# Patient Record
Sex: Female | Born: 1980 | Race: Black or African American | Hispanic: No
Health system: Southern US, Community
[De-identification: ages and names within clinical notes are randomized; demographics above are authoritative.]

---

## 2009-02-28 ENCOUNTER — Emergency Department (HOSPITAL_BASED_OUTPATIENT_CLINIC_OR_DEPARTMENT_OTHER): Admission: EM | Admit: 2009-02-28 | Discharge: 2009-02-28 | Payer: Self-pay | Admitting: Emergency Medicine

## 2009-02-28 ENCOUNTER — Ambulatory Visit: Payer: Self-pay | Admitting: Diagnostic Radiology

## 2009-04-02 ENCOUNTER — Emergency Department (HOSPITAL_BASED_OUTPATIENT_CLINIC_OR_DEPARTMENT_OTHER): Admission: EM | Admit: 2009-04-02 | Discharge: 2009-04-02 | Payer: Self-pay | Admitting: Emergency Medicine

## 2010-08-22 LAB — URINALYSIS, ROUTINE W REFLEX MICROSCOPIC
Glucose, UA: NEGATIVE mg/dL
Specific Gravity, Urine: 1.033 — ABNORMAL HIGH (ref 1.005–1.030)

## 2010-08-22 LAB — URINE MICROSCOPIC-ADD ON

## 2011-04-30 ENCOUNTER — Other Ambulatory Visit (HOSPITAL_COMMUNITY): Payer: Self-pay | Admitting: Obstetrics and Gynecology

## 2011-04-30 DIAGNOSIS — Z369 Encounter for antenatal screening, unspecified: Secondary | ICD-10-CM

## 2011-05-15 ENCOUNTER — Ambulatory Visit (HOSPITAL_COMMUNITY): Payer: Self-pay

## 2011-05-15 ENCOUNTER — Other Ambulatory Visit (HOSPITAL_COMMUNITY): Payer: Self-pay

## 2011-05-16 ENCOUNTER — Other Ambulatory Visit (HOSPITAL_COMMUNITY): Payer: Self-pay | Admitting: Obstetrics and Gynecology

## 2011-05-16 DIAGNOSIS — Z369 Encounter for antenatal screening, unspecified: Secondary | ICD-10-CM

## 2011-05-21 ENCOUNTER — Ambulatory Visit (HOSPITAL_COMMUNITY)
Admission: RE | Admit: 2011-05-21 | Discharge: 2011-05-21 | Disposition: A | Discharge status: Home / Self Care | Payer: Medicaid Other | Source: Ambulatory Visit | Attending: Obstetrics and Gynecology | Admitting: Obstetrics and Gynecology

## 2011-05-21 ENCOUNTER — Ambulatory Visit (HOSPITAL_COMMUNITY): Admission: RE | Admit: 2011-05-21 | Payer: Medicaid Other | Source: Ambulatory Visit

## 2011-05-21 ENCOUNTER — Other Ambulatory Visit (HOSPITAL_COMMUNITY): Payer: Self-pay | Admitting: Obstetrics and Gynecology

## 2011-05-21 ENCOUNTER — Encounter (HOSPITAL_COMMUNITY): Payer: Self-pay

## 2011-05-21 DIAGNOSIS — Z3689 Encounter for other specified antenatal screening: Secondary | ICD-10-CM | POA: Insufficient documentation

## 2011-05-21 DIAGNOSIS — Z369 Encounter for antenatal screening, unspecified: Secondary | ICD-10-CM

## 2011-05-21 DIAGNOSIS — E669 Obesity, unspecified: Secondary | ICD-10-CM | POA: Insufficient documentation

## 2011-05-21 DIAGNOSIS — O99210 Obesity complicating pregnancy, unspecified trimester: Secondary | ICD-10-CM | POA: Insufficient documentation

## 2011-05-21 NOTE — Progress Notes (Signed)
Ms. Depasquale was seen for ultrasound appointment today.  Please see [facility] report for details.

## 2011-05-27 ENCOUNTER — Other Ambulatory Visit: Payer: Self-pay

## 2011-06-25 ENCOUNTER — Ambulatory Visit (HOSPITAL_COMMUNITY)
Admission: RE | Admit: 2011-06-25 | Discharge: 2011-06-25 | Disposition: A | Discharge status: Home / Self Care | Payer: Medicaid Other | Source: Ambulatory Visit | Attending: Obstetrics and Gynecology | Admitting: Obstetrics and Gynecology

## 2011-06-25 DIAGNOSIS — Z3689 Encounter for other specified antenatal screening: Secondary | ICD-10-CM

## 2011-06-25 DIAGNOSIS — Z363 Encounter for antenatal screening for malformations: Secondary | ICD-10-CM | POA: Insufficient documentation

## 2011-06-25 DIAGNOSIS — Z1389 Encounter for screening for other disorder: Secondary | ICD-10-CM | POA: Insufficient documentation

## 2011-06-25 DIAGNOSIS — O99210 Obesity complicating pregnancy, unspecified trimester: Secondary | ICD-10-CM | POA: Insufficient documentation

## 2011-06-25 DIAGNOSIS — O358XX0 Maternal care for other (suspected) fetal abnormality and damage, not applicable or unspecified: Secondary | ICD-10-CM | POA: Insufficient documentation

## 2011-06-25 DIAGNOSIS — E669 Obesity, unspecified: Secondary | ICD-10-CM | POA: Insufficient documentation

## 2011-08-06 ENCOUNTER — Ambulatory Visit (HOSPITAL_COMMUNITY): Admission: RE | Admit: 2011-08-06 | Payer: Medicaid Other | Source: Ambulatory Visit

## 2012-07-12 IMAGING — US US OB COMP +14 WK
1 series · 14 of 28 positions shown · non-contrast
Comparison: none

[Series 1: us ob comp +14 wk · 0.15mm/px · 14 of 31 slices shown]
[im 2/31]
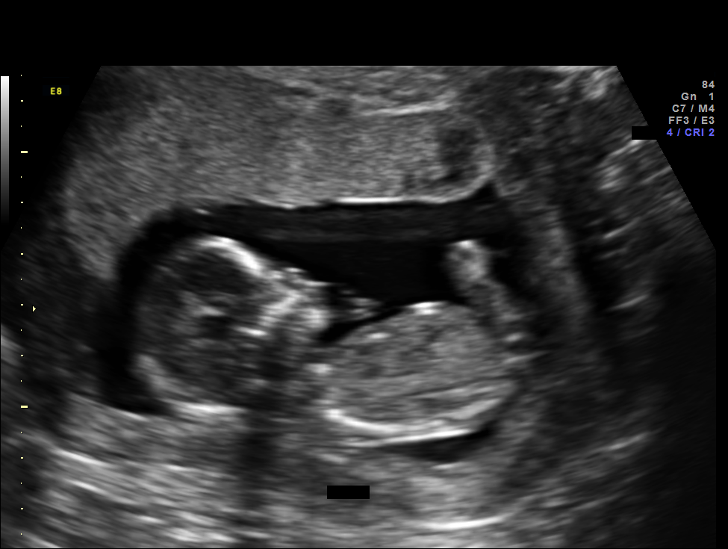
[im 4/31]
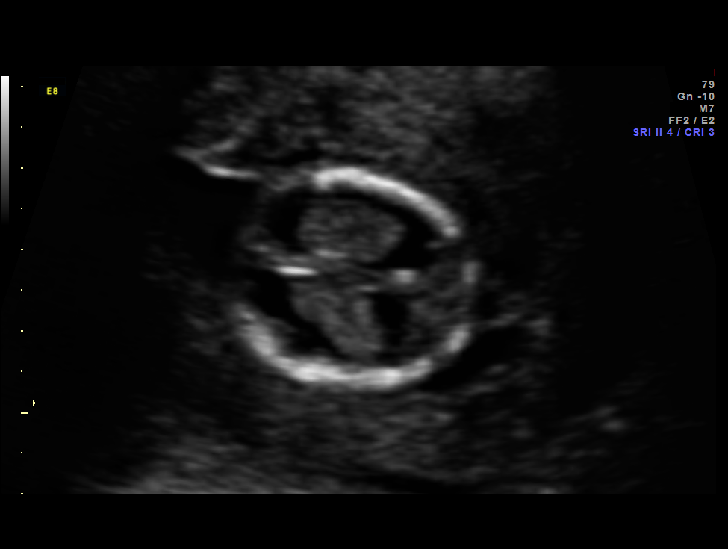
[im 6/31]
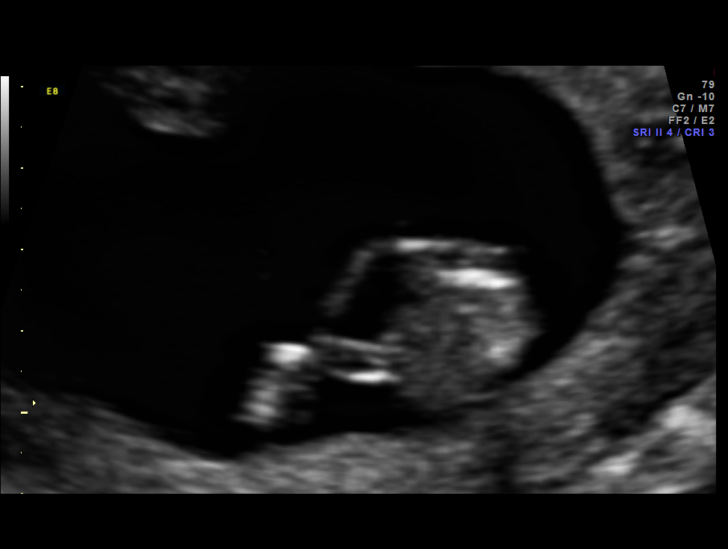
[im 8/31]
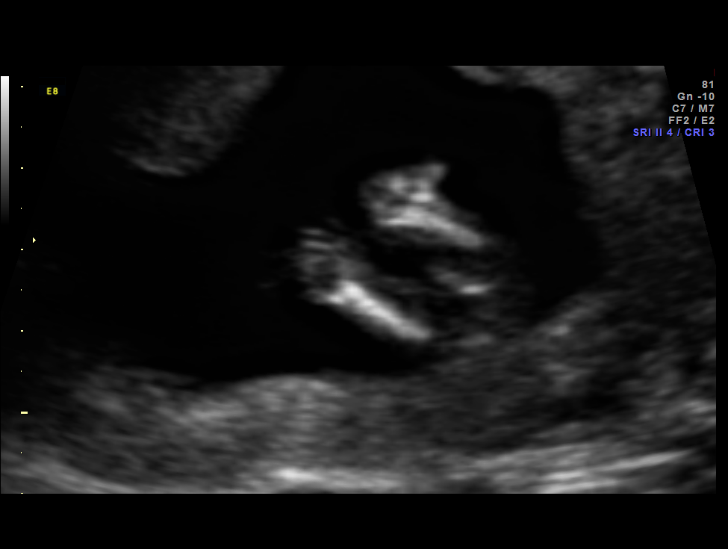
[im 11/31]
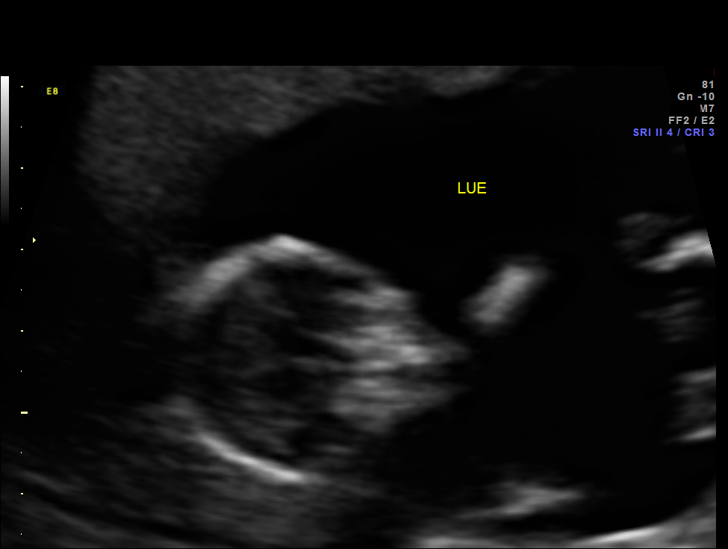
[im 13/31]
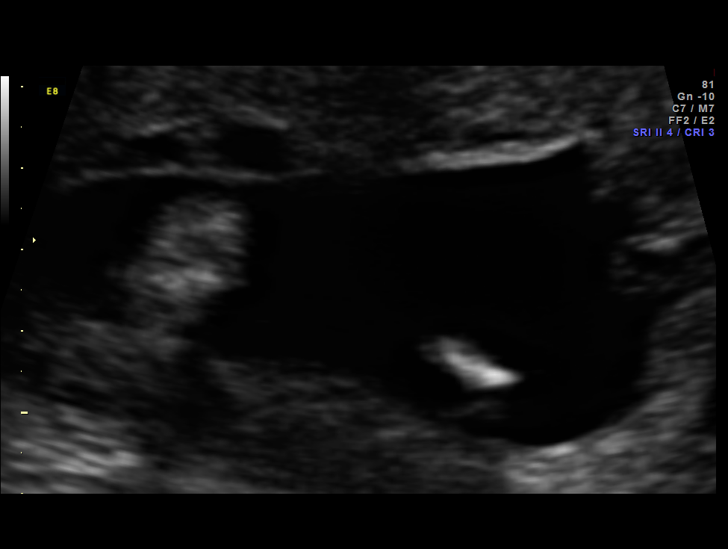
[im 15/31]
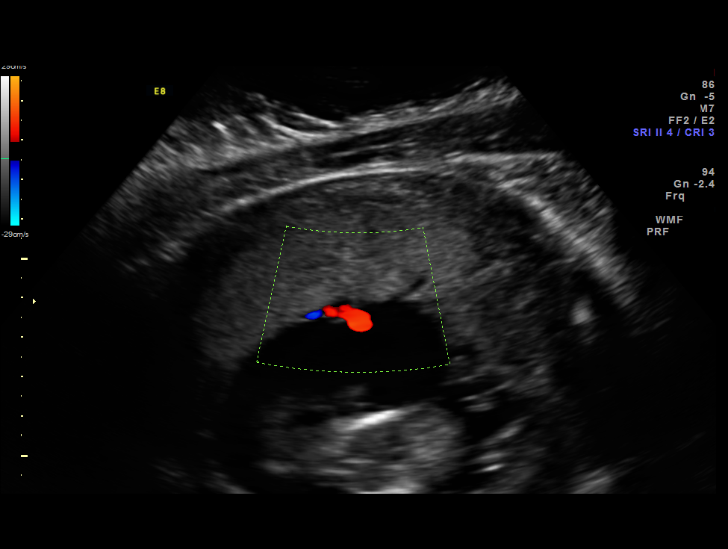
[im 17/31]
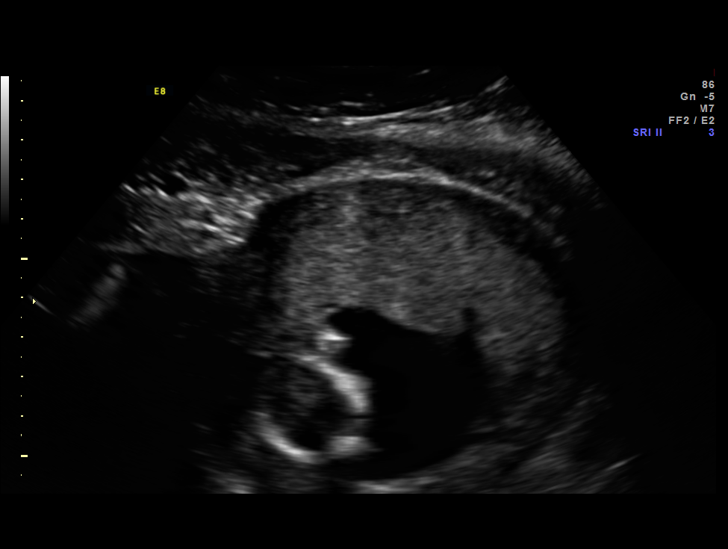
[im 19/31]
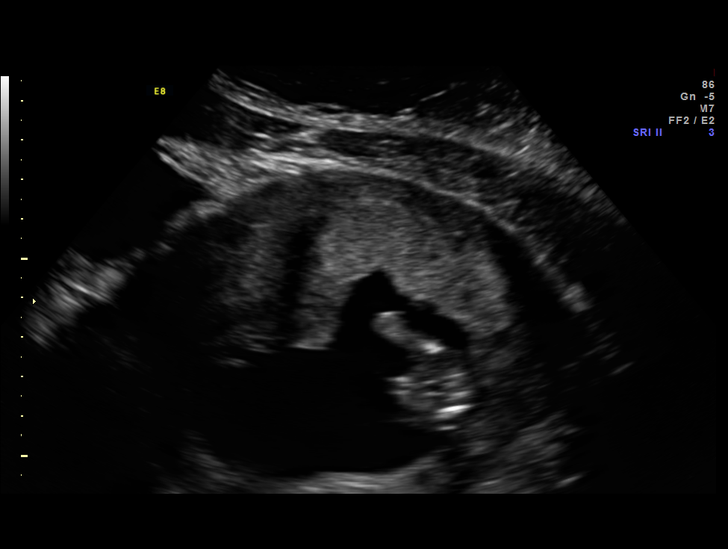
[im 22/31]
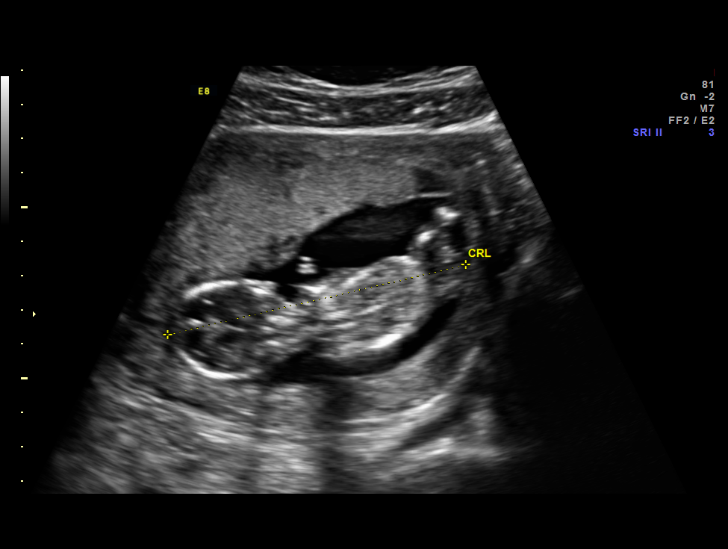
[im 24/31]
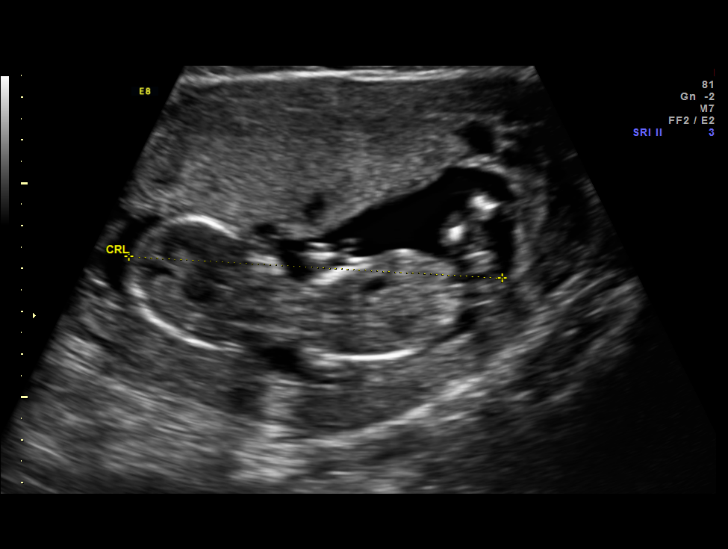
[im 26/31]
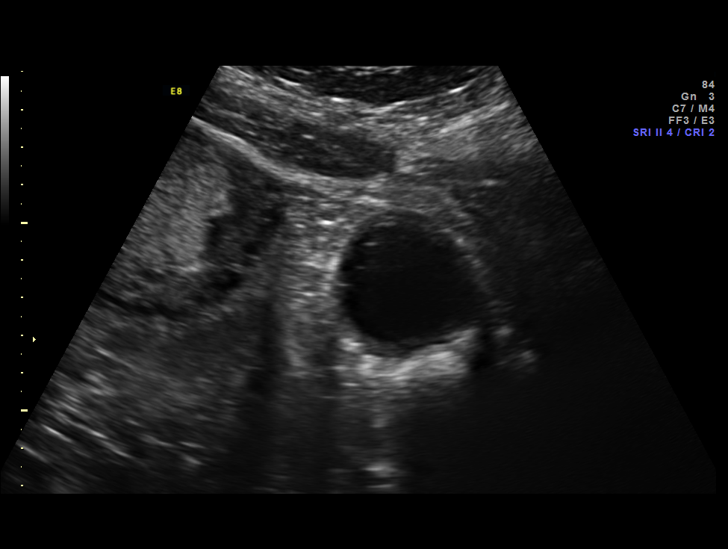
[im 28/31]
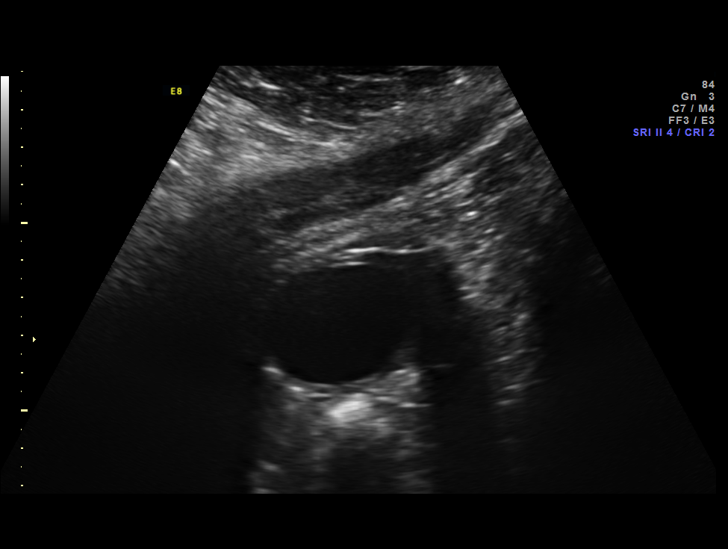
[im 31/31]
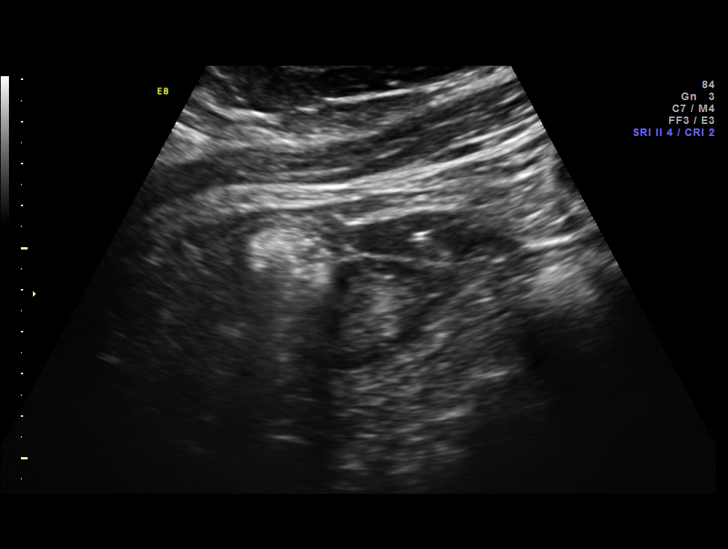

[14 of 28 positions shown; findings below may reference images not displayed]

Canned report from images found in remote index.

Refer to host system for actual result text.

## 2014-03-20 ENCOUNTER — Encounter (HOSPITAL_COMMUNITY): Payer: Self-pay

## 2016-08-20 ENCOUNTER — Ambulatory Visit (HOSPITAL_COMMUNITY): Payer: Medicaid Other | Admitting: Licensed Clinical Social Worker

## 2016-09-16 ENCOUNTER — Ambulatory Visit (HOSPITAL_COMMUNITY): Payer: Medicaid Other | Admitting: Licensed Clinical Social Worker

## 2019-11-26 ENCOUNTER — Ambulatory Visit: Payer: Self-pay

## 2019-11-26 DIAGNOSIS — Z23 Encounter for immunization: Secondary | ICD-10-CM

## 2019-11-26 NOTE — Progress Notes (Signed)
   [facility]ov[facility] Vaccination [facility]  Name:  Kendra Munoz    MRN: [id number] DOB: 08-19-1980  11/26/2019  [name] was observed post Covid-19 immunization for 15 minutes without incident. She was provided with Vaccine Information Sheet and instruction to access the V-Safe system.   [name] was instructed to call 911 with any severe reactions post vaccine: Marland Kitchen Difficulty breathing  . Swelling of face and throat  . A fast heartbeat  . A bad rash all over body  . Dizziness and weakness   Immunizations Administered    Name Date Dose VIS Date Route   Moderna COVID-19 Vaccine 11/26/2019  1:29 PM 0.5 mL 04/2019 Intramuscular   Manufacturer: Moderna   Lot: [id number]   NDC: 70350-093-81

## 2019-12-27 ENCOUNTER — Ambulatory Visit: Payer: Self-pay | Attending: Internal Medicine

## 2019-12-27 DIAGNOSIS — Z23 Encounter for immunization: Secondary | ICD-10-CM

## 2019-12-27 NOTE — Progress Notes (Signed)
° °  Covid-19 Vaccination Clinic  Name:  Kendra Munoz    MRN: [id number] DOB: 17-Jul-1980  12/27/2019  [name] was observed post Covid-19 immunization for 15 minutes without incident. She was provided with Vaccine Information Sheet and instruction to access the V-Safe system.   [name] was instructed to call 911 with any severe reactions post vaccine:  Difficulty breathing   Swelling of face and throat   A fast heartbeat   A bad rash all over body   Dizziness and weakness   Immunizations Administered    Name Date Dose VIS Date Route   Moderna COVID-19 Vaccine 12/27/2019  2:05 PM 0.5 mL 04/2019 Intramuscular   Manufacturer: Moderna   Lot: [id number]   NDC: 56861-683-72
